# Patient Record
Sex: Male | Born: 1990 | Race: White | Hispanic: No | Marital: Single | Smoking: Never smoker
Health system: Southern US, Community
[De-identification: ages and names within clinical notes are randomized; demographics above are authoritative.]

## PROBLEM LIST (undated history)

## (undated) HISTORY — PX: APPENDECTOMY: SHX54

---
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
## 2014-11-29 ENCOUNTER — Encounter: Payer: Self-pay | Admitting: Internal Medicine

## 2014-11-29 ENCOUNTER — Other Ambulatory Visit: Payer: Self-pay | Admitting: Internal Medicine

## 2014-11-29 DIAGNOSIS — K219 Gastro-esophageal reflux disease without esophagitis: Secondary | ICD-10-CM | POA: Insufficient documentation

## 2014-12-24 ENCOUNTER — Ambulatory Visit: Payer: Self-pay | Admitting: Internal Medicine

## 2014-12-27 ENCOUNTER — Ambulatory Visit: Payer: Self-pay | Admitting: Internal Medicine

## 2016-05-10 ENCOUNTER — Emergency Department: Payer: BLUE CROSS/BLUE SHIELD

## 2016-05-10 ENCOUNTER — Encounter: Payer: Self-pay | Admitting: Emergency Medicine

## 2016-05-10 ENCOUNTER — Emergency Department
Admission: EM | Admit: 2016-05-10 | Discharge: 2016-05-11 | Disposition: A | Discharge status: Home / Self Care | Payer: BLUE CROSS/BLUE SHIELD | Attending: Emergency Medicine | Admitting: Emergency Medicine

## 2016-05-10 DIAGNOSIS — R112 Nausea with vomiting, unspecified: Secondary | ICD-10-CM

## 2016-05-10 DIAGNOSIS — K529 Noninfective gastroenteritis and colitis, unspecified: Secondary | ICD-10-CM | POA: Diagnosis not present

## 2016-05-10 DIAGNOSIS — Z79899 Other long term (current) drug therapy: Secondary | ICD-10-CM | POA: Diagnosis not present

## 2016-05-10 DIAGNOSIS — R1030 Lower abdominal pain, unspecified: Secondary | ICD-10-CM | POA: Diagnosis present

## 2016-05-10 DIAGNOSIS — R109 Unspecified abdominal pain: Secondary | ICD-10-CM

## 2016-05-10 LAB — COMPREHENSIVE METABOLIC PANEL
ALBUMIN: 5 g/dL (ref 3.5–5.0)
ALK PHOS: 84 U/L (ref 38–126)
ALT: 29 U/L (ref 17–63)
AST: 36 U/L (ref 15–41)
Anion gap: 13 (ref 5–15)
BILIRUBIN TOTAL: 0.6 mg/dL (ref 0.3–1.2)
BUN: 14 mg/dL (ref 6–20)
CALCIUM: 9.4 mg/dL (ref 8.9–10.3)
CO2: 21 mmol/L — ABNORMAL LOW (ref 22–32)
CREATININE: 1.08 mg/dL (ref 0.61–1.24)
Chloride: 102 mmol/L (ref 101–111)
GFR calc Af Amer: >60 mL/min (ref >60)
GLUCOSE: 130 mg/dL — AB (ref 65–99)
POTASSIUM: 3 mmol/L — AB (ref 3.5–5.1)
Sodium: 136 mmol/L (ref 135–145)
TOTAL PROTEIN: 7.8 g/dL (ref 6.5–8.1)

## 2016-05-10 LAB — CBC
HEMATOCRIT: 48.2 % (ref 40.0–52.0)
HEMOGLOBIN: 17 g/dL (ref 13.0–18.0)
MCH: 31.2 pg (ref 26.0–34.0)
MCHC: 35.2 g/dL (ref 32.0–36.0)
MCV: 88.5 fL (ref 80.0–100.0)
Platelets: 275 10*3/uL (ref 150–440)
RBC: 5.44 MIL/uL (ref 4.40–5.90)
RDW: 12.8 % (ref 11.5–14.5)
WBC: 17.1 10*3/uL — AB (ref 3.8–10.6)

## 2016-05-10 LAB — LIPASE, BLOOD: Lipase: 18 U/L (ref 11–51)

## 2016-05-10 LAB — TROPONIN I

## 2016-05-10 MED ORDER — METRONIDAZOLE 500 MG PO TABS: 500.0000 mg | ORAL_TABLET | Freq: Three times a day (TID) | ORAL | 0 refills | Status: AC

## 2016-05-10 MED ORDER — METRONIDAZOLE 500 MG PO TABS
500.0000 mg | ORAL_TABLET | Freq: Once | ORAL | Status: AC
  Administered 2016-05-10: 500 mg via ORAL
  Filled 2016-05-10: qty 1

## 2016-05-10 MED ORDER — DICYCLOMINE HCL 20 MG PO TABS
20.0000 mg | ORAL_TABLET | Freq: Three times a day (TID) | ORAL | 0 refills | Status: AC | PRN
Start: 2016-05-10 — End: 2017-05-10

## 2016-05-10 MED ORDER — IOPAMIDOL (ISOVUE-300) INJECTION 61%
30.0000 mL | Freq: Once | INTRAVENOUS | Status: AC
  Administered 2016-05-10: 30 mL via ORAL

## 2016-05-10 MED ORDER — MORPHINE SULFATE (PF) 4 MG/ML IV SOLN
INTRAVENOUS | Status: AC
  Administered 2016-05-10: 4 mg via INTRAVENOUS
  Filled 2016-05-10: qty 1

## 2016-05-10 MED ORDER — SODIUM CHLORIDE 0.9 % IV BOLUS (SEPSIS)
1000.0000 mL | Freq: Once | INTRAVENOUS | Status: AC
  Administered 2016-05-10: 1000 mL via INTRAVENOUS

## 2016-05-10 MED ORDER — ONDANSETRON HCL 4 MG/2ML IJ SOLN
INTRAMUSCULAR | Status: AC
  Administered 2016-05-10: 4 mg via INTRAVENOUS
  Filled 2016-05-10: qty 2

## 2016-05-10 MED ORDER — MORPHINE SULFATE (PF) 4 MG/ML IV SOLN
4.0000 mg | Freq: Once | INTRAVENOUS | Status: AC
  Administered 2016-05-10: 4 mg via INTRAVENOUS

## 2016-05-10 MED ORDER — CIPROFLOXACIN HCL 500 MG PO TABS: 500.0000 mg | ORAL_TABLET | Freq: Two times a day (BID) | ORAL | 0 refills | Status: AC

## 2016-05-10 MED ORDER — POTASSIUM CHLORIDE CRYS ER 20 MEQ PO TBCR
40.0000 meq | EXTENDED_RELEASE_TABLET | Freq: Once | ORAL | Status: AC
  Administered 2016-05-10: 40 meq via ORAL
  Filled 2016-05-10: qty 2

## 2016-05-10 MED ORDER — METOCLOPRAMIDE HCL 10 MG PO TABS: 10.0000 mg | ORAL_TABLET | Freq: Four times a day (QID) | ORAL | 0 refills | Status: AC | PRN

## 2016-05-10 MED ORDER — IOPAMIDOL (ISOVUE-300) INJECTION 61%
100.0000 mL | Freq: Once | INTRAVENOUS | Status: AC | PRN
  Administered 2016-05-10: 100 mL via INTRAVENOUS

## 2016-05-10 MED ORDER — ONDANSETRON HCL 4 MG/2ML IJ SOLN
4.0000 mg | Freq: Once | INTRAMUSCULAR | Status: AC
  Administered 2016-05-10: 4 mg via INTRAVENOUS

## 2016-05-10 MED ORDER — CIPROFLOXACIN HCL 500 MG PO TABS
500.0000 mg | ORAL_TABLET | Freq: Once | ORAL | Status: AC
  Administered 2016-05-10: 500 mg via ORAL
  Filled 2016-05-10: qty 1

## 2016-05-10 NOTE — ED Notes (Signed)
This RN to bedside at this time. Pt continues to be in no acute distress, sitting up in bed with parents at bedside, states pain still at approx 1/10. This RN explained that I would be handing off to StanleyRachel, Charity fundraiserN. Pt states understanding at this time.

## 2016-05-10 NOTE — ED Notes (Signed)
Pt sitting up in bed drinking contrast at this time. NAD noted. Pt's family at bedside. Denies any needs at this time.

## 2016-05-10 NOTE — ED Provider Notes (Addendum)
[facility] Emergency Department Provider Note  ____________________________________________   First MD Initiated Contact with Patient 05/10/16 2107     (approximate)  I have reviewed the triage vital signs and the nursing notes.   HISTORY  Chief Complaint Loss of Consciousness and Emesis   HPI Justin Grant is a 25 y.o. male with a history of an appendectomy as well as gastroesophageal reflux disease and lactose intolerance who is presenting with multiple episodes of vomiting as well as intermittent lower abdominal cramping. He is with his family says that about 2 hours ago after eating cheese at dinner he began to have vomiting as well as lower abdominal pain. Questionable also single episode in triage here. Says that he has also had pain in the past after eating dairy-containing products but not this bad. Says that his pain is severe and a 10 out of 10 in his lower extremities. Says that when he has this pain he also feels that his extremities "lock up."He has been able to move his bowels without any diarrhea since the start of this episode. No blood in the vomit but the family says that he has vomited about 20 times.   History reviewed. No pertinent past medical history.  Patient Active Problem List   Diagnosis Date Noted  . Gastroesophageal reflux disease 11/29/2014    Past Surgical History:  Procedure Laterality Date  . APPENDECTOMY      Prior to Admission medications   Medication Sig Start Date End Date Taking? Authorizing Provider  ranitidine (ZANTAC) 150 MG tablet Take 1 tablet by mouth daily as needed.    Historical Provider, MD    Allergies Patient has no known allergies.  Family History  Problem Relation Age of Onset  . Breast cancer Mother     Social History Social History  Substance Use Topics  . Smoking status: Never Smoker  . Smokeless tobacco: Never Used  . Alcohol use 0.0 oz/week     Comment: Occassionally    Review of  Systems Constitutional: No fever/chills Eyes: No visual changes. ENT: No sore throat. Cardiovascular: Denies chest pain. Respiratory: Denies shortness of breath. Gastrointestinal: No diarrhea.  No constipation. Genitourinary: Negative for dysuria. Musculoskeletal: Negative for back pain. Skin: Negative for rash. Neurological: Negative for headaches, focal weakness or numbness.  10-point ROS otherwise negative.  ____________________________________________   PHYSICAL EXAM:  VITAL SIGNS: ED Triage Vitals  Enc Vitals Group     BP 05/10/16 2107 123/73     Pulse Rate 05/10/16 2107 82     Resp 05/10/16 2107 18     Temp 05/10/16 2107 98.1 F (36.7 C)     Temp Source 05/10/16 2107 Oral     SpO2 05/10/16 2107 97 %     Weight 05/10/16 2107 160 lb (72.6 kg)     Height 05/10/16 2107 5\' 8"  (1.727 m)     Head Circumference --      Peak Flow --      Pain Score 05/10/16 2114 4     Pain Loc --      Pain Edu? --      Excl. in GC? --     Constitutional: Alert and oriented. Patient yelling in pain. Called into the room by the nurse because of patient distress.  Eyes: Conjunctivae are normal. PERRL. EOMI. Head: Atraumatic. Nose: No congestion/rhinnorhea. Mouth/Throat: Mucous membranes are moist.   Neck: No stridor.   Cardiovascular: Normal rate, regular rhythm. Grossly normal heart sounds.  Respiratory: Normal respiratory effort.  No retractions. Lungs CTAB. Gastrointestinal: Soft with mild lower abdominal tenderness palpation. No distention.  No CVA tenderness. Musculoskeletal: No lower extremity tenderness nor edema.  No joint effusions. Neurologic:  Normal speech and language. No gross focal neurologic deficits are appreciated. No gait instability. Skin:  Skin is warm, dry and intact. No rash noted. Psychiatric: Mood and affect are normal. Speech and behavior are normal.  ____________________________________________   LABS (all labs ordered are listed, but only abnormal results  are displayed)  Labs Reviewed  CBC - Abnormal; Notable for the following:       Result Value   WBC 17.1 (*)    All other components within normal limits  COMPREHENSIVE METABOLIC PANEL - Abnormal; Notable for the following:    Potassium 3.0 (*)    CO2 21 (*)    Glucose, Bld 130 (*)    All other components within normal limits  TROPONIN I  LIPASE, BLOOD  URINALYSIS, COMPLETE (UACMP) WITH MICROSCOPIC   ____________________________________________  EKG  ED ECG REPORT I, Arelia Longest, the attending physician, personally viewed and interpreted this ECG.   Date: 05/10/2016  EKG Time: 2105  Rate: 64  Rhythm: normal sinus rhythm  Axis: normal  Intervals:none  ST&T Change: No ST segment elevation or depression. No abnormal T-wave inversion.  ____________________________________________  RADIOLOGY  CT Abdomen Pelvis W Contrast (Final result)  Result time 05/10/16 23:00:08  Final result by Rubye Oaks, MD (05/10/16 23:00:08)           Narrative:   CLINICAL DATA: Lower abdominal pain and vomiting. Syncope.  EXAM: CT ABDOMEN AND PELVIS WITH CONTRAST  TECHNIQUE: Multidetector CT imaging of the abdomen and pelvis was performed using the standard protocol following bolus administration of intravenous contrast.  CONTRAST: ISOVUE-300 IOPAMIDOL (ISOVUE-300) INJECTION 61%  COMPARISON: None.  FINDINGS: Lower chest: The lung bases are clear.  Hepatobiliary: No focal liver abnormality is seen. No gallstones, gallbladder wall thickening, or biliary dilatation.  Pancreas: No ductal dilatation or inflammation.  Spleen: Normal in size without focal abnormality.  Adrenals/Urinary Tract: Adrenal glands are unremarkable. Kidneys are normal, without renal calculi, focal lesion, or hydronephrosis. Bladder is unremarkable.  Stomach/Bowel: Minimal enteric contrast in the distal esophagus without wall thickening. Stomach is distended with ingested contrast.  No small bowel dilatation or inflammation. Post appendectomy. Moderate stool in the ascending colon. Mild colonic wall thickening involving the hepatic flexure, transverse, and descending colon. Faint pericolonic edema about the descending colon. No significant diverticular change.  Vascular/Lymphatic: Scattered mesenteric nodes are most prominent in the ileocolic chain. No retroperitoneal adenopathy. No acute vascular abnormality.  Reproductive: Prostate is unremarkable.  Other: No free air, free fluid, or intra-abdominal fluid collection.  Musculoskeletal: There are no acute or suspicious osseous abnormalities.  IMPRESSION: 1. Mild colonic wall thickening from the hepatic flexure through the descending colon, suggesting mild colitis. This may be infectious or inflammatory. 2. Minimal contrast in the distal esophagus, delayed clearance or reflux.   Electronically Signed By: Rubye Oaks M.D. On: 05/10/2016 23:00            ____________________________________________   PROCEDURES  Procedure(s) performed:   Procedures  Critical Care performed:   ____________________________________________   INITIAL IMPRESSION / ASSESSMENT AND PLAN / ED COURSE  Pertinent labs & imaging results that were available during my care of the patient were reviewed by me and considered in my medical decision making (see chart for details).    Clinical Course   -----------------------------------------  11:28 PM on 05/10/2016 -----------------------------------------  Patient resting comfortably at this time. Sitting up in bed smiling. Findings of colitis on his CAT scan. We'll discharge with Cipro, Flagyl, Bentyl as well as Reglan. Differential includes infectious versus inflammatory colitis. Patient also lactose intolerant and ate a lot of cheese tonight. I recommended that he follow-up with a gastroenterologist. He says that he will be returning to [place] in several days  and will need to follow up out there. I encouraged him to follow-up in [place] because of recurrent episodes of similar symptoms. He is understanding of this plan and willing to comply. We also discussed follow up with gastroenterologist may include a colonoscopy which could help give a definitive diagnosis.   ____________________________________________   FINAL CLINICAL IMPRESSION(S) / ED DIAGNOSES  Colitis. Vomiting. Abdominal pain.    NEW MEDICATIONS STARTED DURING THIS VISIT:  New Prescriptions   No medications on file     Note:  This document was prepared using Dragon voice recognition software and may include unintentional dictation errors.    Myrna Blazeravid Matthew Harlie Buening, MD 05/10/16 2330  Patient s syncope likely vasovagal. Very reassuring cardiac workup.    Myrna Blazeravid Matthew Americus Perkey, MD 05/10/16 [id number]

## 2016-05-10 NOTE — ED Triage Notes (Signed)
Pt presents to ED with c/o syncope and emesis. Pt's mom states that they were at a restaurant and pt ate cheese (suspected lactose intolerant, never diagnosed). Pt's mom reports that patient began having several episodes of vomiting and then "several episodes of syncope". First Nurse reports patient was slumped while out front being registered. Pt is alert and oriented on arrival to room. Pt is able to answer questions appropriately.

## 2016-05-10 NOTE — ED Notes (Signed)
MD to bedside at this time. Pt noted to be diaphoretic at this time. Pt gripping his stomach and rolling around. Pt's family at bedside attempting to massage pt's calves to comfort him. MD aware, pain medication administered.

## 2016-05-10 NOTE — ED Notes (Signed)
Report given to Rachel, RN.
